# Patient Record
Sex: Female | Born: 1975 | Hispanic: No | Marital: Single | Smoking: Never smoker
Health system: Southern US, Community
[De-identification: ages and names within clinical notes are randomized; demographics above are authoritative.]

## PROBLEM LIST (undated history)

## (undated) DIAGNOSIS — E079 Disorder of thyroid, unspecified: Secondary | ICD-10-CM

---

## 2001-11-05 ENCOUNTER — Other Ambulatory Visit: Admission: RE | Admit: 2001-11-05 | Discharge: 2001-11-05 | Payer: Self-pay | Admitting: *Deleted

## 2003-12-10 ENCOUNTER — Inpatient Hospital Stay (HOSPITAL_COMMUNITY): Admission: AD | Admit: 2003-12-10 | Discharge: 2003-12-10 | Payer: Self-pay | Admitting: Obstetrics and Gynecology

## 2003-12-21 ENCOUNTER — Inpatient Hospital Stay (HOSPITAL_COMMUNITY): Admission: AD | Admit: 2003-12-21 | Discharge: 2003-12-21 | Payer: Self-pay | Admitting: Obstetrics and Gynecology

## 2019-05-30 ENCOUNTER — Encounter (HOSPITAL_COMMUNITY): Payer: Self-pay

## 2019-05-30 ENCOUNTER — Emergency Department (HOSPITAL_COMMUNITY)
Admission: EM | Admit: 2019-05-30 | Discharge: 2019-05-30 | Disposition: A | Discharge status: Home / Self Care | Payer: BC Managed Care – PPO | Attending: Emergency Medicine | Admitting: Emergency Medicine

## 2019-05-30 ENCOUNTER — Emergency Department (HOSPITAL_COMMUNITY): Payer: BC Managed Care – PPO

## 2019-05-30 ENCOUNTER — Other Ambulatory Visit: Payer: Self-pay

## 2019-05-30 DIAGNOSIS — R030 Elevated blood-pressure reading, without diagnosis of hypertension: Secondary | ICD-10-CM | POA: Insufficient documentation

## 2019-05-30 DIAGNOSIS — Y939 Activity, unspecified: Secondary | ICD-10-CM | POA: Insufficient documentation

## 2019-05-30 DIAGNOSIS — S8001XA Contusion of right knee, initial encounter: Secondary | ICD-10-CM | POA: Diagnosis not present

## 2019-05-30 DIAGNOSIS — Y92410 Unspecified street and highway as the place of occurrence of the external cause: Secondary | ICD-10-CM | POA: Diagnosis not present

## 2019-05-30 DIAGNOSIS — Y999 Unspecified external cause status: Secondary | ICD-10-CM | POA: Diagnosis not present

## 2019-05-30 DIAGNOSIS — S8991XA Unspecified injury of right lower leg, initial encounter: Secondary | ICD-10-CM | POA: Diagnosis present

## 2019-05-30 DIAGNOSIS — S8990XA Unspecified injury of unspecified lower leg, initial encounter: Secondary | ICD-10-CM

## 2019-05-30 HISTORY — DX: Disorder of thyroid, unspecified: E07.9
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
## 2019-05-30 NOTE — ED Notes (Signed)
Pt was verbalized discharge instructions. Pt had no further questions at this time. NAD. Unable to obtain signature due to not working [employer].
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
## 2023-04-23 ENCOUNTER — Other Ambulatory Visit (HOSPITAL_BASED_OUTPATIENT_CLINIC_OR_DEPARTMENT_OTHER): Payer: Self-pay

## 2023-04-23 MED ORDER — WEGOVY 0.5 MG/0.5ML ~~LOC~~ SOAJ
0.5000 mg | SUBCUTANEOUS | 0 refills | Status: AC
  Filled 2023-04-23 – 2023-05-13 (×3): qty 2, 28d supply, fill #0

## 2023-04-24 ENCOUNTER — Other Ambulatory Visit (HOSPITAL_BASED_OUTPATIENT_CLINIC_OR_DEPARTMENT_OTHER): Payer: Self-pay

## 2023-04-25 ENCOUNTER — Other Ambulatory Visit (HOSPITAL_BASED_OUTPATIENT_CLINIC_OR_DEPARTMENT_OTHER): Payer: Self-pay

## 2023-04-26 ENCOUNTER — Other Ambulatory Visit (HOSPITAL_BASED_OUTPATIENT_CLINIC_OR_DEPARTMENT_OTHER): Payer: Self-pay

## 2023-04-29 ENCOUNTER — Other Ambulatory Visit (HOSPITAL_BASED_OUTPATIENT_CLINIC_OR_DEPARTMENT_OTHER): Payer: Self-pay

## 2023-04-30 ENCOUNTER — Other Ambulatory Visit (HOSPITAL_BASED_OUTPATIENT_CLINIC_OR_DEPARTMENT_OTHER): Payer: Self-pay

## 2023-05-01 ENCOUNTER — Other Ambulatory Visit (HOSPITAL_BASED_OUTPATIENT_CLINIC_OR_DEPARTMENT_OTHER): Payer: Self-pay

## 2023-05-02 ENCOUNTER — Other Ambulatory Visit (HOSPITAL_BASED_OUTPATIENT_CLINIC_OR_DEPARTMENT_OTHER): Payer: Self-pay

## 2023-05-02 ENCOUNTER — Other Ambulatory Visit: Payer: Self-pay

## 2023-05-07 ENCOUNTER — Other Ambulatory Visit (HOSPITAL_COMMUNITY): Payer: Self-pay

## 2023-05-13 ENCOUNTER — Other Ambulatory Visit (HOSPITAL_BASED_OUTPATIENT_CLINIC_OR_DEPARTMENT_OTHER): Payer: Self-pay
